# Patient Record
Sex: Male | Born: 1995 | Race: Black or African American | Hispanic: No | Marital: Single | Smoking: Never smoker
Health system: Southern US, Community
[De-identification: ages and names within clinical notes are randomized; demographics above are authoritative.]

## PROBLEM LIST (undated history)

## (undated) DIAGNOSIS — B20 Human immunodeficiency virus [HIV] disease: Secondary | ICD-10-CM

## (undated) DIAGNOSIS — Z21 Asymptomatic human immunodeficiency virus [HIV] infection status: Secondary | ICD-10-CM

## (undated) DIAGNOSIS — F419 Anxiety disorder, unspecified: Secondary | ICD-10-CM

## (undated) HISTORY — DX: Asymptomatic human immunodeficiency virus (hiv) infection status: Z21

## (undated) HISTORY — DX: Anxiety disorder, unspecified: F41.9

## (undated) HISTORY — DX: Human immunodeficiency virus (HIV) disease: B20

## (undated) HISTORY — PX: OTHER SURGICAL HISTORY: SHX169

---
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
## 2017-01-22 MED FILL — SULFAMETHOXAZOLE/TMP DS TAB: 800-160 | 30 days supply | Qty: 30 | Fill #0

## 2017-01-22 MED FILL — TRIUMEQ 600-50-300 MG TABS: 600-50-300 | 30 days supply | Qty: 30 | Fill #0

## 2017-05-21 ENCOUNTER — Telehealth: Payer: Self-pay | Admitting: *Deleted

## 2017-05-21 NOTE — Telephone Encounter (Signed)
Patient called asking where he can go to have his labs drawn in [place], [place]. [place], but he is now residing in [place] and does not have transportation to come here to have his labs drawn or to see the MD. Advised him to go to the health dept in [place] and let them know he is positive and they can request records. He said that he will most likely transfer to a [place] ID clinic. He will let [facility] know when his new MD is in place.

## 2017-08-18 ENCOUNTER — Emergency Department (HOSPITAL_COMMUNITY): Payer: Managed Care, Other (non HMO)

## 2017-08-18 ENCOUNTER — Encounter (HOSPITAL_COMMUNITY): Payer: Self-pay | Admitting: Emergency Medicine

## 2017-08-18 ENCOUNTER — Emergency Department (HOSPITAL_COMMUNITY)
Admission: EM | Admit: 2017-08-18 | Discharge: 2017-08-18 | Disposition: A | Discharge status: Home / Self Care | Payer: Managed Care, Other (non HMO) | Attending: Emergency Medicine | Admitting: Emergency Medicine

## 2017-08-18 DIAGNOSIS — B20 Human immunodeficiency virus [HIV] disease: Secondary | ICD-10-CM | POA: Insufficient documentation

## 2017-08-18 DIAGNOSIS — R1013 Epigastric pain: Secondary | ICD-10-CM | POA: Diagnosis not present

## 2017-08-18 DIAGNOSIS — R140 Abdominal distension (gaseous): Secondary | ICD-10-CM | POA: Insufficient documentation

## 2017-08-18 DIAGNOSIS — Z79899 Other long term (current) drug therapy: Secondary | ICD-10-CM | POA: Insufficient documentation

## 2017-08-18 LAB — COMPREHENSIVE METABOLIC PANEL
ALK PHOS: 67 U/L (ref 38–126)
ALT: 14 U/L — ABNORMAL LOW (ref 17–63)
ANION GAP: 6 (ref 5–15)
AST: 21 U/L (ref 15–41)
Albumin: 3.5 g/dL (ref 3.5–5.0)
BILIRUBIN TOTAL: 0.7 mg/dL (ref 0.3–1.2)
BUN: 5 mg/dL — AB (ref 6–20)
CALCIUM: 9 mg/dL (ref 8.9–10.3)
CO2: 26 mmol/L (ref 22–32)
Chloride: 105 mmol/L (ref 101–111)
Creatinine, Ser: 1.16 mg/dL (ref 0.61–1.24)
GFR calc Af Amer: >60 mL/min (ref >60)
GLUCOSE: 90 mg/dL (ref 65–99)
Potassium: 3.5 mmol/L (ref 3.5–5.1)
Sodium: 137 mmol/L (ref 135–145)
TOTAL PROTEIN: 8.1 g/dL (ref 6.5–8.1)

## 2017-08-18 LAB — URINALYSIS, ROUTINE W REFLEX MICROSCOPIC
Bacteria, UA: NONE SEEN
Bilirubin Urine: NEGATIVE
GLUCOSE, UA: NEGATIVE mg/dL
Hgb urine dipstick: NEGATIVE
Ketones, ur: NEGATIVE mg/dL
Nitrite: NEGATIVE
Protein, ur: NEGATIVE mg/dL
SPECIFIC GRAVITY, URINE: 1.024 (ref 1.005–1.030)
SQUAMOUS EPITHELIAL / LPF: NONE SEEN
pH: 5 (ref 5.0–8.0)

## 2017-08-18 LAB — CBC
HEMATOCRIT: 43.2 % (ref 39.0–52.0)
HEMOGLOBIN: 14.6 g/dL (ref 13.0–17.0)
MCH: 27.3 pg (ref 26.0–34.0)
MCHC: 33.8 g/dL (ref 30.0–36.0)
MCV: 80.7 fL (ref 78.0–100.0)
Platelets: 173 10*3/uL (ref 150–400)
RBC: 5.35 MIL/uL (ref 4.22–5.81)
RDW: 12.9 % (ref 11.5–15.5)
WBC: 5.8 10*3/uL (ref 4.0–10.5)

## 2017-08-18 LAB — LIPASE, BLOOD: Lipase: 23 U/L (ref 11–51)

## 2017-08-18 NOTE — ED Provider Notes (Signed)
[facility]-EMER[facility]ENCY DEPT Provider Note   CSN: [id number] Arrival date & time: 08/18/17  1478   History   Chief Complaint Chief Complaint  Patient presents with  . Abdominal Pain  . Bloated    HPI Phillip [name] is a 21 y.o. male.  The patient has a PMH of HIV, last CD4 =314 with VL =117 in 11/2016 currently off ART since around July, who presents with a 5 day history of epigastric abdominal pain, bloating, constipation, and nausea/vomiting. The patient states that he first noticed bloating and constipation on Monday. He went to urgent care and was told that he likely had some gas and or constipation. Since then he has been drinking prune juice and has drank magnesium citrate to help with constipation. After these interventions the patient has had multiple, small volume bowel movements. He states that his bowel movements started as pellets, but have recently become more liquid in nature. He denies blood in stool and dark, tarry stools. He notes worsening epigastric pain that does not radiate to other quadrants. He denies symptoms of acid reflux and has not noticed any change in pain with eating. He denies fevers, decreased  chills, SOB, chest pain, and hematemesis.   Patient states that he has not taken ART since July because he was living in [place] and didn't have access to [place] doctors to refill his medicine. Since returning to [employer] [facility] for school, the patient states that he wants to reestablish care with previous ID doctors to get the medications he needs. He thinks he may need to change HIV medications when he restarts taking them, however, because his previous medicines caused him to have eczema.    The history is provided by the patient.    Past Medical History:  Diagnosis Date  . Anxiety   . HIV infection [facility])     Patient Active Problem List   Diagnosis Date Noted  . HIV disease (HCC) 09/04/2016  . 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 Gonorrhea 09/04/2016  . Generalized  anxiety disorder 09/04/2016    Past Surgical History:  Procedure Laterality Date  . Testicular torsion       Home Medications    Prior to Admission medications   Medication Sig Start Date End Date Taking? Authorizing Provider  abacavir-dolutegravir-lamiVUDine (TRIUMEQ) 600-50-300 MG tablet Take 1 tablet by mouth daily. 10/09/16   Cliffton Asters, MD  cetirizine (ZYRTEC) 10 MG tablet Take 10 mg by mouth daily.    [provider]  sulfamethoxazole-trimethoprim (BACTRIM DS,SEPTRA DS) 800-160 MG tablet Take 1 tablet by mouth daily. 10/09/16   Cliffton Asters, MD    Family History Family History  Problem Relation Age of Onset  . Hypertension Mother     Social History Social History  Substance Use Topics  . Smoking status: Never Smoker  . Smokeless tobacco: Never Used  . Alcohol use No    Allergies   Patient has no allergy information on record.   Review of Systems Review of Systems  Constitutional: Negative for chills, diaphoresis, fatigue and fever.  HENT: Negative for congestion, sinus pain and sinus pressure.   Respiratory: Negative for cough.   Cardiovascular: Negative for chest pain and leg swelling.  Gastrointestinal: Positive for abdominal distention, abdominal pain, constipation, diarrhea, nausea and vomiting. Negative for anal bleeding, blood in stool and rectal pain.  Genitourinary: Negative for difficulty urinating, dysuria, flank pain, frequency and hematuria.  Musculoskeletal: Negative for arthralgias and myalgias.  Skin: Negative for rash.   Physical Exam Updated Vital Signs BP Marland Kitchen)  136/91 (BP Location: Right Arm)   Pulse 79   Temp 98.9 F (37.2 C) (Oral)   Resp 16   Ht 5\' 8"  (1.727 m)   Wt 71.2 kg (157 lb)   SpO2 100%   BMI 23.87 kg/m   Physical Exam  Constitutional: He appears well-developed and well-nourished. No distress.  HENT:  Mouth/Throat: Oropharynx is clear and moist. No oropharyngeal exudate.  Cardiovascular: Normal rate,  regular rhythm and intact distal pulses.  Exam reveals no friction rub.   No murmur heard. Pulmonary/Chest: Effort normal. No respiratory distress. He has no wheezes.  No crackles  Abdominal: Soft. He exhibits no distension and no mass. There is tenderness (epigastric region). There is no rebound and no guarding.  Musculoskeletal: He exhibits no edema (of bilateral lower extremities) or tenderness (of bilateral lower extremities).  Lymphadenopathy:    He has no cervical adenopathy.  Skin: Skin is warm and dry. Capillary refill takes less than 2 seconds. No rash noted. He is not diaphoretic. No erythema.    ED Treatments / Results  Labs (all labs ordered are listed, but only abnormal results are displayed) Labs Reviewed  COMPREHENSIVE METABOLIC PANEL - Abnormal; Notable for the following:       Result Value   BUN 5 (*)    ALT 14 (*)    All other components within normal limits  CBC  LIPASE, BLOOD  URINALYSIS, ROUTINE W REFLEX MICROSCOPIC    EKG  EKG Interpretation None       Radiology Dg Abd Acute W/chest  Result Date: 08/18/2017 CLINICAL DATA:  Five day history of abdominal pain associated with nausea and vomiting. Current history of HIV. EXAM: DG ABDOMEN ACUTE W/ 1V CHEST COMPARISON:  None. FINDINGS: Bowel gas pattern unremarkable without evidence of obstruction or significant ileus. No evidence of free air or significant air-fluid levels on the erect image. Expected stool burden in the colon. Phleboliths in the left side of the pelvis. No visible opaque urinary tract calculi. Cardiomediastinal silhouette unremarkable. Lungs clear. Bronchovascular markings normal. Pulmonary vascularity normal. No visible pleural effusions. No pneumothorax. Thoracolumbar dextroscoliosis. IMPRESSION: 1. No acute abdominal abnormality. 2.  No acute cardiopulmonary disease. Electronically Signed   By: Hulan Saas M.D.   On: 08/18/2017 10:50   Procedures Procedures (including critical care  time)  Medications Ordered in ED Medications - No data to display   Initial Impression / Assessment and Plan / ED Course  I have reviewed the triage vital signs and the nursing notes.  Pertinent labs & imaging results that were available during my care of the patient were reviewed by me and considered in my medical decision making (see chart for details).  Patient's history of constipation, bloating, nausea, and vomiting are concerning for obstruction. The patient, however, has been able to pass gas and has had multiple bowel movements (including 1 during the interview) which is not consistent with this diagnosis. The patient could have an ileus, which sometimes results from electrolyte disturbances that can result from decreased PO intake/increased vomiting. I plan to obtain acute abdominal X-rays to rule out obstruction and/or ileus.  The patient's epigastric pain is not described as sharp, does not change with eating, and does not radiate. Will obtain lipase and CMP to rule out hepatitis and pancreatitis, although these are very unlikely given the patient's progression of symptoms, stable vital signs within normal limits, and PE without upper quadrant tenderness.  The new onset diarrhea and recent history of being unable to take ART  is concerning for infective colitis. The patient does not have signs/symptoms of colitis on exam, as he does not have lower quadrant abdominal pain. Will obtain CBC to determine if WBC levels are at baseline compared to previous levels in 2017 on ART. I have a low suspicion for infectious colitis given the patient's overall presentation. I think it is more likely that the patient has over-treated his constipation, which is why he has new onset diarrhea. Will consider stool studies, but these would likely be better performed as an outpatient if the diarrhea persists for longer periods of time.   Final Clinical Impressions(s) / ED Diagnoses   Final diagnoses:  None    The patient's CMP and lipase are within normal limits. His WBC level is similar to historical values. He remains afebrile and other vital signs are within normal limits. No acute evidence of obstruction observed on abdominal plain film imaging. This is all reassuring and supportive of the hypothesis that the patient's recent onset of nausea and diarrhea are a result of OTC medication over use.   As stated above, the patient likely had constipation earlier in the week which was over-corrected with OTC medications and now has cramping, nausea, and diarrhea associated with increased laxative and GI stimulant use. Patient will be discharged with instructions to stay hydrated and to stop OTC anti-constipation medicines. He was provided with information about good bowel regimens to be used in the future that may prevent similar presentations to the ED.   He was also given information to follow up with previous ID clinic providers since he has not been seen for awhile and has been off ART.   New Prescriptions New Prescriptions   No medications on file     Rozann LeschesNedrud, Ercel Pepitone, MD 08/18/17 1300

## 2017-08-18 NOTE — ED Triage Notes (Signed)
Pt reports ABD pain that started on Monday. Pt took a bottle of Mag citrate  On wed. With some stool results. Pt reports he has had constipation in the past.

## 2017-08-18 NOTE — ED Provider Notes (Signed)
I saw and evaluated the patient, reviewed the resident's note and I agree with the findings and plan.   EKG Interpretation None     21 year old male here cleaning of diarrhea after taking medications for constipation. Abdominal exam is benign. Workup is reassuring. Will be discharged home   Lorre NickAllen, Yuuki Skeens, MD 08/18/17 1228

## 2017-08-18 NOTE — ED Triage Notes (Signed)
Pt. Stated, Lavenia Atlasve been bloated and stomach hurt. I went to UC and they said it was gas and should go away shortly.  I thought I was constipated but Ive gone to the bathroom.

## 2017-08-18 NOTE — ED Notes (Signed)
Declined W/C at D/C and was escorted to lobby by RN. 

## 2017-08-28 ENCOUNTER — Other Ambulatory Visit: Payer: Self-pay | Admitting: Gastroenterology

## 2017-08-28 DIAGNOSIS — R1013 Epigastric pain: Secondary | ICD-10-CM

## 2017-09-05 ENCOUNTER — Ambulatory Visit (INDEPENDENT_AMBULATORY_CARE_PROVIDER_SITE_OTHER): Payer: Managed Care, Other (non HMO) | Admitting: Pharmacist Clinician (PhC)/ Clinical Pharmacy Specialist

## 2017-09-05 ENCOUNTER — Other Ambulatory Visit: Payer: Managed Care, Other (non HMO)

## 2017-09-05 DIAGNOSIS — B20 Human immunodeficiency virus [HIV] disease: Secondary | ICD-10-CM | POA: Diagnosis not present

## 2017-09-05 DIAGNOSIS — Z23 Encounter for immunization: Secondary | ICD-10-CM | POA: Diagnosis not present

## 2017-09-05 MED FILL — TRIUMEQ 600-50-300 MG TABS: 600-50-300 | 30 days supply | Qty: 30 | Fill #1

## 2017-09-05 NOTE — Progress Notes (Signed)
HPI: Phillip [name] is a 21 y.o. male who is here to get his HIV labs.   Allergies: Not on File  Vitals:    Past Medical History: Past Medical History:  Diagnosis Date  . Anxiety   . HIV infection [facility])     Social History: Social History   Social History  . Marital status: Single    Spouse name: N/A  . Number of children: N/A  . Years of education: N/A   Social History Main Topics  . Smoking status: Never Smoker  . Smokeless tobacco: Never Used  . Alcohol use No  . Drug use: No  . Sexual activity: Not Currently    Partners: Male    Birth control/ protection: Condom     Comment: given condoms   Other Topics Concern  . Not on file   Social History Narrative  . No narrative on file    Previous Regimen: None  Current Regimen: Triumeq  Labs: HIV 1 RNA Quant (copies/mL)  Date Value  11/21/2016 117 (H)  09/04/2016 245,715 (H)   CD4 T Cell Abs (/uL)  Date Value  11/21/2016 370 (L)  09/04/2016 160 (L)   Hep B S Ab (no units)  Date Value  09/04/2016 POS (A)   Hepatitis B Surface Ag (no units)  Date Value  09/04/2016 NEGATIVE   HCV Ab (no units)  Date Value  09/04/2016 NEGATIVE    CrCl: CrCl cannot be calculated (Unknown ideal weight.).  Lipids:    Component Value Date/Time   CHOL 100 (L) 09/04/2016 1355   TRIG 81 09/04/2016 1355   HDL 27 (L) 09/04/2016 1355   CHOLHDL 3.7 09/04/2016 1355   VLDL 16 09/04/2016 1355   LDLCALC 57 09/04/2016 1355    Assessment: Phillip [name] was here to get his HIV labs before the visit with Dr. Orvan Falconer in OctFeliz [name] came into our office and ask if we could talk to him about the rash issue. He is currently on Triumeq and Bactrim for his HIV. During the visit he said that has some facial rash. He denied having any STD symptoms (chancre, bodily rashes, ect). The rash is mostly in his face but I can't see any today. He has not seen Phillip [name] since 2017 because he stated that he was working at [employer] in [place] during this  summer. The rash could be do either Triumeq or Septra. However, after reviewing his chart after the visit, it looks like he complained of similar rash at the initial visit with Dr. Orvan Falconer. His CD4 was 370 back in Dec. Told him that we could try a process of elimination to see if this is a drug reaction. He is going hold the Bactrim for 2 weeks until the appt with Dr. Orvan Falconer in Oct.   During the visit, he stated that he has been good about taking his meds. I took him to the pharmacy office to get him a map to [facility] pharmacy, Kathie Rhodes said that his refill has been overdue for several months. Subsequently, the story came out that his adherence was not the best in June and July due to some "stomach" symptoms. Now, I fear that he may have developed resistance already. He probably start taking meds again knowing that his appt with Dr. Orvan Falconer is coming up. A viral without reflex was done today. I got Travis/Katrina to change it to reflex (all classes). Told him to make sure to show up for the visit with Dr. Orvan Falconer in 2 weeks because we may  need to change his ART.   Start hep A, Menveo series today along with flu.   Recommendations:  Hold Bactrim x2 wks Continue Triumeq for now Change HIV VL to reflex Hep A #1, Menveo #1, flu today F/u in 2 mohths for Menveo #2 F/u with Dr. Orvan Falconer in 2 wks  Phillip [name], PharmD, BCPS, AAHIVP, CPP Clinical Infectious Disease Pharmacist [facility] 09/05/2017, 1:51 PM

## 2017-09-05 NOTE — Patient Instructions (Signed)
Stop Bactrim to see if the rash is resolved. We could consider changing Triumeq at the appt with Dr. Orvan Falconer if it's still there.

## 2017-09-06 LAB — FLUORESCENT TREPONEMAL AB(FTA)-IGG-BLD: Fluorescent Treponemal ABS: REACTIVE — AB

## 2017-09-06 LAB — COMPREHENSIVE METABOLIC PANEL
AG RATIO: 1 (calc) (ref 1.0–2.5)
ALT: 53 U/L — AB (ref 8–46)
AST: 270 U/L — ABNORMAL HIGH (ref 12–32)
Albumin: 4.3 g/dL (ref 3.6–5.1)
Alkaline phosphatase (APISO): 61 U/L (ref 48–230)
BILIRUBIN TOTAL: 0.4 mg/dL (ref 0.2–1.1)
BUN: 9 mg/dL (ref 7–20)
CALCIUM: 9.6 mg/dL (ref 8.9–10.4)
CO2: 32 mmol/L (ref 20–32)
Chloride: 103 mmol/L (ref 98–110)
Creat: 1.17 mg/dL (ref 0.60–1.26)
Globulin: 4.2 g/dL (calc) — ABNORMAL HIGH (ref 2.1–3.5)
Glucose, Bld: 97 mg/dL (ref 65–99)
Potassium: 4.6 mmol/L (ref 3.8–5.1)
SODIUM: 141 mmol/L (ref 135–146)
TOTAL PROTEIN: 8.5 g/dL — AB (ref 6.3–8.2)

## 2017-09-06 LAB — T-HELPER CELL (CD4) - (RCID CLINIC ONLY)
CD4 % Helper T Cell: 16 % — ABNORMAL LOW (ref 33–55)
CD4 T Cell Abs: 260 /uL — ABNORMAL LOW (ref 400–2700)

## 2017-09-06 LAB — CBC
HEMATOCRIT: 45.3 % (ref 38.5–50.0)
HEMOGLOBIN: 15.5 g/dL (ref 13.2–17.1)
MCH: 27.5 pg (ref 27.0–33.0)
MCHC: 34.2 g/dL (ref 32.0–36.0)
MCV: 80.5 fL (ref 80.0–100.0)
MPV: 9.1 fL (ref 7.5–12.5)
Platelets: 300 10*3/uL (ref 140–400)
RBC: 5.63 10*6/uL (ref 4.20–5.80)
RDW: 13.1 % (ref 11.0–15.0)
WBC: 6.1 10*3/uL (ref 3.8–10.8)

## 2017-09-06 LAB — LIPID PANEL
Cholesterol: 92 mg/dL (ref <170)
HDL: 25 mg/dL — ABNORMAL LOW (ref >45)
LDL Cholesterol (Calc): 51 mg/dL (calc) (ref <110)
Non-HDL Cholesterol (Calc): 67 mg/dL (calc) (ref <120)
TRIGLYCERIDES: 76 mg/dL (ref <90)
Total CHOL/HDL Ratio: 3.7 (calc) (ref <5.0)

## 2017-09-06 LAB — RPR TITER: RPR Titer: 1:1 {titer} — ABNORMAL HIGH

## 2017-09-06 LAB — RPR: RPR Ser Ql: REACTIVE — AB

## 2017-09-18 ENCOUNTER — Ambulatory Visit (INDEPENDENT_AMBULATORY_CARE_PROVIDER_SITE_OTHER): Payer: Managed Care, Other (non HMO) | Admitting: Internal Medicine

## 2017-09-18 ENCOUNTER — Other Ambulatory Visit: Payer: Self-pay | Admitting: Pharmacist

## 2017-09-18 ENCOUNTER — Encounter: Payer: Self-pay | Admitting: Internal Medicine

## 2017-09-18 ENCOUNTER — Ambulatory Visit (INDEPENDENT_AMBULATORY_CARE_PROVIDER_SITE_OTHER): Payer: Managed Care, Other (non HMO) | Admitting: Licensed Clinical Social Worker

## 2017-09-18 DIAGNOSIS — A5149 Other secondary syphilitic conditions: Secondary | ICD-10-CM | POA: Diagnosis not present

## 2017-09-18 DIAGNOSIS — B20 Human immunodeficiency virus [HIV] disease: Secondary | ICD-10-CM

## 2017-09-18 DIAGNOSIS — K5909 Other constipation: Secondary | ICD-10-CM | POA: Diagnosis not present

## 2017-09-18 DIAGNOSIS — K5900 Constipation, unspecified: Secondary | ICD-10-CM | POA: Insufficient documentation

## 2017-09-18 DIAGNOSIS — F4322 Adjustment disorder with anxiety: Secondary | ICD-10-CM

## 2017-09-18 DIAGNOSIS — F411 Generalized anxiety disorder: Secondary | ICD-10-CM

## 2017-09-18 NOTE — Patient Instructions (Signed)
Try taking over-the-counter docusate for your constipation.

## 2017-09-18 NOTE — Progress Notes (Signed)
Patient Active Problem List   Diagnosis Date Noted  . HIV disease (HCC) 09/04/2016    Priority: High  . Constipation 09/18/2017  . 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 Gonorrhea 09/04/2016  . Generalized anxiety disorder 09/04/2016    Patient's Medications  New Prescriptions   No medications on file  Previous Medications   ABACAVIR-DOLUTEGRAVIR-LAMIVUDINE (TRIUMEQ) 600-50-300 MG TABLET    Take 1 tablet by mouth daily.   SULFAMETHOXAZOLE-TRIMETHOPRIM (BACTRIM DS,SEPTRA DS) 800-160 MG TABLET    Take 1 tablet by mouth daily.  Modified Medications   No medications on file  Discontinued Medications   No medications on file    Subjective: Phillip [name] is in for his routine HIV follow-up visit. He stopped taking his Triumeq last March when he developed a flare of his eczema and was feeling tired. He thought that those symptoms might be side effects of his Septra or Triumeq. 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 He started back on his Triumeq on 09/05/2017. He has not missed any doses since then. He is tolerating it well. He has not been sexually active recently. He has had problems with severe constipation for the past several months. He has had to strain to have a bowel movement and has noted some discomfort and a few spots of blood on the toilet paper.  Review of Systems: Review of Systems  Constitutional: Negative for chills, diaphoresis, fever, malaise/fatigue and weight loss.  HENT: Negative for sore throat.   Respiratory: Negative for cough, sputum production and shortness of breath.   Cardiovascular: Negative for chest pain.  Gastrointestinal: Positive for blood in stool and constipation. Negative for abdominal pain, diarrhea, heartburn, nausea and vomiting.  Genitourinary: Negative for dysuria and frequency.  Musculoskeletal: Negative for joint pain and myalgias.  Skin: Negative for rash.    Neurological: Negative for dizziness and headaches.  Psychiatric/Behavioral: Negative for depression and substance abuse. The patient is not nervous/anxious.        He tells me that he "over thinks things".    Past Medical History:  Diagnosis Date  . Anxiety   . HIV infection [facility])     Social History  Substance Use Topics  . Smoking status: Never Smoker  . Smokeless tobacco: Never Used  . Alcohol use No    Family History  Problem Relation Age of Onset  . Hypertension Mother     Not on File  Objective:  Vitals:   09/18/17 1403  BP: 127/75  Pulse: (!) 123  Temp: (!) 101.1 F (38.4 C)  TempSrc: Oral  Weight: 150 lb 6.4 oz (68.2 kg)   Body mass index is 22.87 kg/m.  Physical Exam  Constitutional: He is oriented to person, place, and time.  He is smiling and in good spirits.  HENT:  Mouth/Throat: No oropharyngeal exudate.  Eyes: Conjunctivae are normal.  Cardiovascular: Normal rate and regular rhythm.   No murmur heard. Pulmonary/Chest: Effort normal and breath sounds normal.  Abdominal: Soft. He exhibits no mass. There is no tenderness.  Musculoskeletal: Normal range of motion.  Neurological: He is alert and oriented to person, place, and time.  Skin: No rash noted.  Psychiatric: Mood and affect normal.    Lab Results Lab Results  Component Value Date   WBC 6.1 09/05/2017   HGB 15.5 09/05/2017   HCT 45.3 09/05/2017   MCV 80.5 09/05/2017   PLT 300  09/05/2017    Lab Results  Component Value Date   CREATININE 1.17 09/05/2017   BUN 9 09/05/2017   NA 141 09/05/2017   K 4.6 09/05/2017   CL 103 09/05/2017   CO2 32 09/05/2017    Lab Results  Component Value Date   ALT 53 (H) 09/05/2017   AST 270 (H) 09/05/2017   ALKPHOS 67 08/18/2017   BILITOT 0.4 09/05/2017    Lab Results  Component Value Date   CHOL 92 09/05/2017   HDL 25 (L) 09/05/2017   LDLCALC 57 09/04/2016   TRIG 76 09/05/2017   CHOLHDL 3.7 09/05/2017   Lab Results  Component Value  Date   LABRPR REACTIVE (A) 09/05/2017   RPRTITER 1:1 (H) 09/05/2017   HIV 1 RNA Quant (copies/mL)  Date Value  09/05/2017 99,200 (H)  09/05/2017 131,000 (H)  11/21/2016 117 (H)   CD4 T Cell Abs (/uL)  Date Value  09/05/2017 260 (L)  11/21/2016 370 (L)  09/04/2016 160 (L)     Problem List Items Addressed This Visit      High   HIV disease (HCC)    His infection went out of control after stopping his Triumeq 6 months ago. I will repeat his lab work in 6 weeks.      Relevant Orders   T-helper cell (CD4)- ([facility] clinic only)   HIV 1 RNA quant-no reflex-bld   CBC   Comprehensive metabolic panel     Unprioritized   Constipation    I will have him try over-the-counter docusate.      Generalized anxiety disorder    I introduced him to our counselor, Vergia Alberts, today. He says that his relationship with his mother is very good now. He denies feeling anxious or depressed currently but does admit that his over thinking of issues can sometimes cause him distress.      Secondary syphilis    His RPR has decreased from 1:64 down to 1:1 following treatment for syphilis last year.           Cliffton Asters, MD [facility] [phone] pager   [phone] cell 09/18/2017, 2:31 PM

## 2017-09-18 NOTE — Assessment & Plan Note (Signed)
His infection went out of control after stopping his Triumeq 6 months ago. I will repeat his lab work in 6 weeks.

## 2017-09-18 NOTE — Assessment & Plan Note (Signed)
His RPR has decreased from 1:64 down to 1:1 following treatment for syphilis last year.

## 2017-09-18 NOTE — Assessment & Plan Note (Signed)
I introduced him to our counselor, Vergia Alberts, today. He says that his relationship with his mother is very good now. He denies feeling anxious or depressed currently but does admit that his over thinking of issues can sometimes cause him distress.

## 2017-09-18 NOTE — Assessment & Plan Note (Signed)
I will have him try over-the-counter docusate.

## 2017-09-19 NOTE — BH Specialist Note (Signed)
Integrated Behavioral Health Initial Visit  MRN: [id number] Name: Daric Koren  Number of Integrated Behavioral Health Clinician visits:: 1/6 Session Start time: 2:26 pm  Session End time: 2:43 pm Total time: 17 mins  Type of Service: Integrated Behavioral Health- Individual/Family Interpretor:No. Interpretor Name and Language: N/A   Warm Hand Off Completed.       SUBJECTIVE: Bronislaus Verdell is a 21 y.o. male accompanied by self Patient was referred by Dr. Orvan Falconer for anxiety.  Patient reports the following symptoms/concerns: Patient reported experience of continous anxious mood.  At best he experiences "1/2 anxious mood and 1/2 content mood".  Patient reported experience of thoughts fixed on his future and his occupation, but denied parental pressures.  Patient reported uncertainty with his major and creative skills and was receptive to utilizing services of [employer] for interest/career inventories.  Patient denied extended periods of sadness or depression, denied past self-mutilation, denied past suicide attempt, and denied current suicidal ideations, plan, or intent.  Duration of problem: 2 months; Severity of problem: mild  OBJECTIVE: Mood: Anxious and Affect: within range Risk of harm to self or others: No plan to harm self or others  LIFE CONTEXT: Family and Social: Patient is from [place] and the majority of his family is there.  Patient reported having friends and a support network on campus. School/Work: Patient attends [employer]. Self-Care: Patient is able to tend to his ADL's but has issues with treatment compliance.  Patient stopped taking HIV medications in March due to his allergies and because he did not know how to get his medications while home on summer semester break. 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  ASSESSMENT: Patient is currently experiencing college/career-related anxieties and may benefit from assessment and  intervention from the [employer] and [facility].  GOALS ADDRESSED: Patient will: 1. Reduce symptoms of: anxiety 2. Increase knowledge and/or ability of: coping skills, healthy habits and stress reduction  3. Demonstrate ability to: Increase healthy adjustment to current life circumstances, Increase adequate support systems for patient/family and Increase motivation to adhere to plan of care  INTERVENTIONS: Interventions utilized: Supportive Counseling and Link to [facility]    PLAN: 1. Referral(s): Counselor 2. Patient will contact [facility] if additional assistance is required in the future.  Vergia Alberts, [facility]

## 2017-09-20 LAB — TEST AUTHORIZATION

## 2017-09-20 LAB — HIV-1 INTEGRASE GENOTYPE

## 2017-09-20 LAB — HIV-1 RNA QUANT-NO REFLEX-BLD
HIV 1 RNA Quant: 99200 copies/mL — ABNORMAL HIGH
HIV-1 RNA Quant, Log: 5 Log copies/mL — ABNORMAL HIGH

## 2017-09-20 LAB — HIV RNA, RTPCR W/R GT (RTI, PI,INT)
HIV 1 RNA QUANT: 131000 {copies}/mL — AB
HIV-1 RNA QUANT, LOG: 5.12 {Log_copies}/mL — AB

## 2017-09-20 LAB — HIV-1 GENOTYPE: HIV-1 Genotype: DETECTED — AB

## 2017-10-01 MED FILL — TRIUMEQ 600-50-300 MG TABS: 600-50-300 | 30 days supply | Qty: 30 | Fill #2

## 2017-10-25 ENCOUNTER — Other Ambulatory Visit: Payer: Self-pay | Admitting: Internal Medicine

## 2017-10-25 DIAGNOSIS — B20 Human immunodeficiency virus [HIV] disease: Secondary | ICD-10-CM

## 2017-10-25 MED FILL — TRIUMEQ 600-50-300 MG TABS: 600-50-300 | 30 days supply | Qty: 30 | Fill #0

## 2017-11-06 ENCOUNTER — Ambulatory Visit: Payer: Managed Care, Other (non HMO)

## 2017-11-12 ENCOUNTER — Ambulatory Visit (INDEPENDENT_AMBULATORY_CARE_PROVIDER_SITE_OTHER): Payer: Managed Care, Other (non HMO) | Admitting: Pharmacist Clinician (PhC)/ Clinical Pharmacy Specialist

## 2017-11-12 DIAGNOSIS — B20 Human immunodeficiency virus [HIV] disease: Secondary | ICD-10-CM

## 2017-11-12 DIAGNOSIS — Z23 Encounter for immunization: Secondary | ICD-10-CM

## 2017-11-12 MED ORDER — ABACAVIR-DOLUTEGRAVIR-LAMIVUD 600-50-300 MG PO TABS: 1.0000 | ORAL_TABLET | Freq: Every day | ORAL | 2 refills | Status: AC

## 2017-11-12 NOTE — Progress Notes (Signed)
HPI: Phillip [name] is a 21 y.o. male who is here to f/u with pharmacy for his second menveo shot.   Allergies: No Known Allergies  Vitals:    Past Medical History: Past Medical History:  Diagnosis Date  . Anxiety   . HIV infection [facility](HCC)     Social History: Social History   Socioeconomic History  . Marital status: Single    Spouse name: Not on file  . Number of children: Not on file  . Years of education: Not on file  . Highest education level: Not on file  Social Needs  . Financial resource strain: Not on file  . Food insecurity - worry: Not on file  . Food insecurity - inability: Not on file  . Transportation needs - medical: Not on file  . Transportation needs - non-medical: Not on file  Occupational History  . Not on file  Tobacco Use  . Smoking status: Never Smoker  . Smokeless tobacco: Never Used  Substance and Sexual Activity  . Alcohol use: No  . Drug use: No  . Sexual activity: Not Currently    Partners: Male    Birth control/protection: Condom    Comment: given condoms  Other Topics Concern  . Not on file  Social History Narrative  . Not on file    Previous Regimen: None  Current Regimen: Triumeq  Labs: HIV 1 RNA Quant (copies/mL)  Date Value  09/05/2017 99,200 (H)  09/05/2017 131,000 (H)  11/21/2016 117 (H)   CD4 T Cell Abs (/uL)  Date Value  09/05/2017 260 (L)  11/21/2016 370 (L)  09/04/2016 160 (L)   Hep B S Ab (no units)  Date Value  09/04/2016 POS (A)   Hepatitis B Surface Ag (no units)  Date Value  09/04/2016 NEGATIVE   HCV Ab (no units)  Date Value  09/04/2016 NEGATIVE    CrCl: CrCl cannot be calculated (Patient's most recent lab result is older than the maximum 21 days allowed.).  Lipids:    Component Value Date/Time   CHOL 92 09/05/2017 1120   TRIG 76 09/05/2017 1120   HDL 25 (L) 09/05/2017 1120   CHOLHDL 3.7 09/05/2017 1120   VLDL 16 09/04/2016 1355   LDLCALC 57 09/04/2016 1355    Assessment: Phillip [name] is here  for his second Menveo vaccine with pharmacy. He stated that his mom has just changed job and has lost her insurance. He is trying to get help with bridging his care so he doesn't run out of meds.  He stated that he has not missed any doses since the last visit. We went through [employer] patient assistance to get him 3 mo of coverage. This will be plenty of time for his mom's insurance to kick in. [facility] can [facility] out to him. While he is here, we are going to get all labs since he has an appt with Dr. Orvan Falconerampbell next week.   Recommendations:  Menveo #2 Continue Triumeq 1 PO qday ([facility]) All HIV meds today  Ulyses SouthwardMinh Catrena Vari, PharmD, BCPS, AAHIVP, CPP Clinical Infectious Disease Pharmacist [facility] 11/12/2017, 4:44 PM

## 2017-11-13 LAB — COMPREHENSIVE METABOLIC PANEL
AG RATIO: 1 (calc) (ref 1.0–2.5)
ALT: 9 U/L (ref 8–46)
AST: 19 U/L (ref 12–32)
Albumin: 4 g/dL (ref 3.6–5.1)
Alkaline phosphatase (APISO): 55 U/L (ref 48–230)
BILIRUBIN TOTAL: 0.5 mg/dL (ref 0.2–1.1)
BUN: 10 mg/dL (ref 7–20)
CALCIUM: 9.1 mg/dL (ref 8.9–10.4)
CO2: 29 mmol/L (ref 20–32)
Chloride: 105 mmol/L (ref 98–110)
Creat: 1.06 mg/dL (ref 0.60–1.26)
Globulin: 4 g/dL (calc) — ABNORMAL HIGH (ref 2.1–3.5)
Glucose, Bld: 102 mg/dL — ABNORMAL HIGH (ref 65–99)
Potassium: 4.2 mmol/L (ref 3.8–5.1)
Sodium: 141 mmol/L (ref 135–146)
Total Protein: 8 g/dL (ref 6.3–8.2)

## 2017-11-13 LAB — CBC
HEMATOCRIT: 42.4 % (ref 38.5–50.0)
HEMOGLOBIN: 14.7 g/dL (ref 13.2–17.1)
MCH: 28.4 pg (ref 27.0–33.0)
MCHC: 34.7 g/dL (ref 32.0–36.0)
MCV: 82 fL (ref 80.0–100.0)
MPV: 8.9 fL (ref 7.5–12.5)
Platelets: 237 10*3/uL (ref 140–400)
RBC: 5.17 10*6/uL (ref 4.20–5.80)
RDW: 13.2 % (ref 11.0–15.0)
WBC: 4.8 10*3/uL (ref 3.8–10.8)

## 2017-11-13 MED FILL — TRIUMEQ 600-50-300 MG TABS: 600-50-300 | 30 days supply | Qty: 30 | Fill #0

## 2017-11-14 LAB — HIV-1 RNA QUANT-NO REFLEX-BLD
HIV 1 RNA Quant: 119000 copies/mL — ABNORMAL HIGH
HIV-1 RNA Quant, Log: 5.08 Log copies/mL — ABNORMAL HIGH

## 2017-11-14 LAB — T-HELPER CELL (CD4) - (RCID CLINIC ONLY)
CD4 T CELL ABS: 400 /uL (ref 400–2700)
CD4 T CELL HELPER: 18 % — AB (ref 33–55)

## 2017-11-19 ENCOUNTER — Ambulatory Visit: Payer: Managed Care, Other (non HMO) | Admitting: Internal Medicine

## 2017-11-22 ENCOUNTER — Ambulatory Visit: Payer: Managed Care, Other (non HMO) | Admitting: Internal Medicine

## 2018-01-08 ENCOUNTER — Ambulatory Visit: Payer: Managed Care, Other (non HMO) | Admitting: Internal Medicine

## 2018-01-10 MED FILL — TRIUMEQ 600-50-300 MG TABS: 600-50-300 | 30 days supply | Qty: 30 | Fill #1

## 2018-01-17 ENCOUNTER — Ambulatory Visit: Payer: Self-pay | Admitting: Internal Medicine

## 2019-02-13 IMAGING — DX DG ABDOMEN ACUTE W/ 1V CHEST
3 series · 3 of 3 positions shown · non-contrast
Comparison: None.

CLINICAL DATA: Five day history of abdominal pain associated with
nausea and vomiting. Current history of HIV.

EXAM:
DG ABDOMEN ACUTE W/ 1V CHEST

[chest pa]
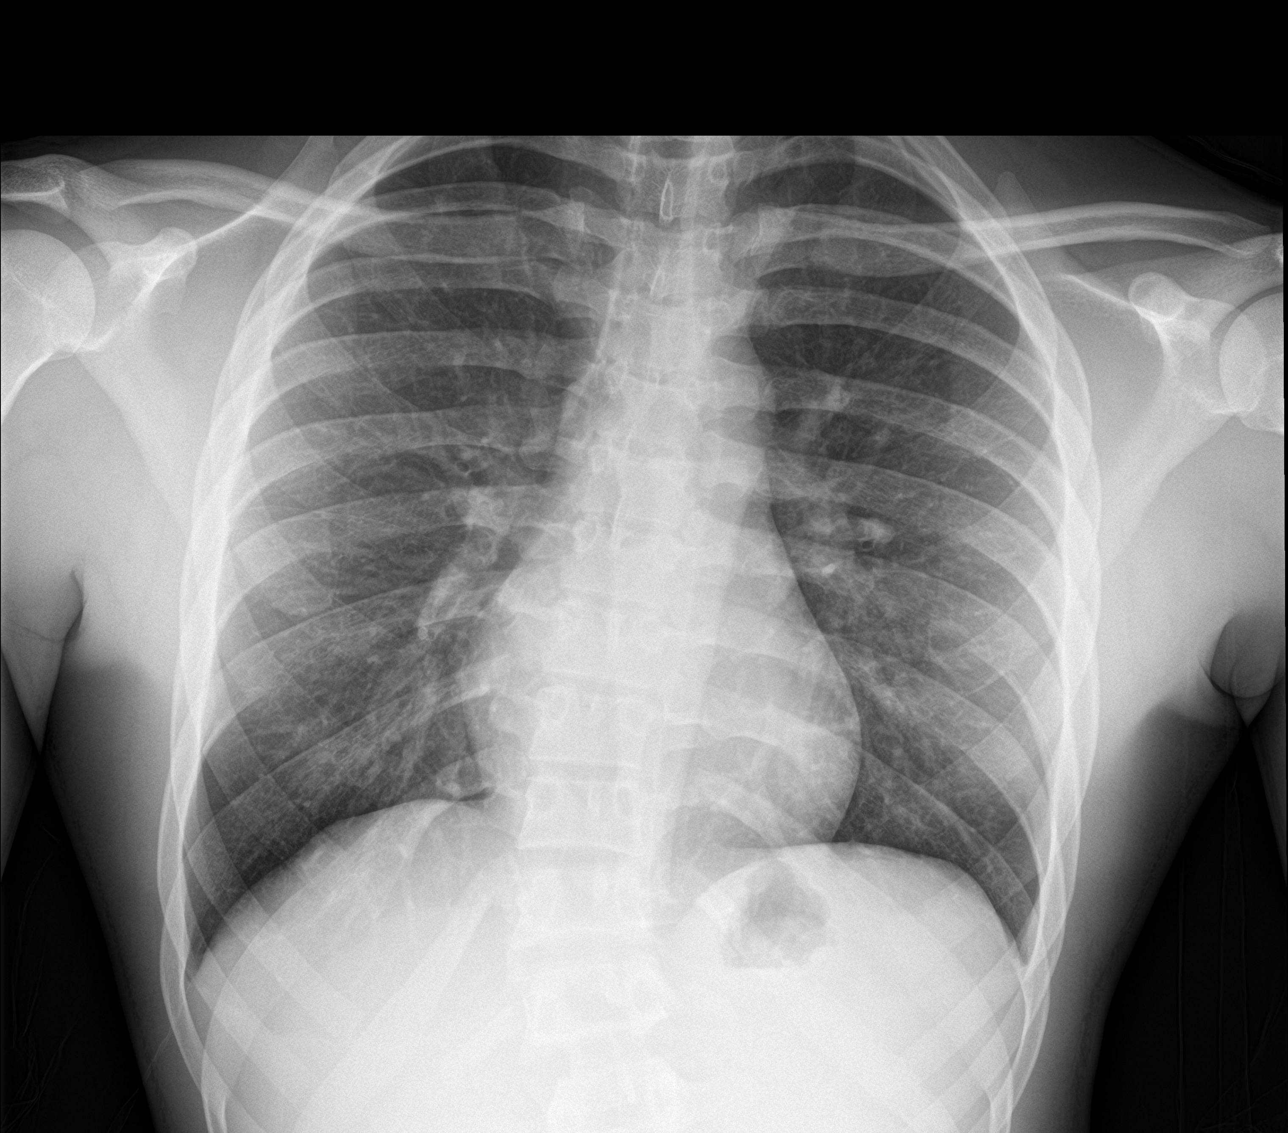

[abdomen erect]
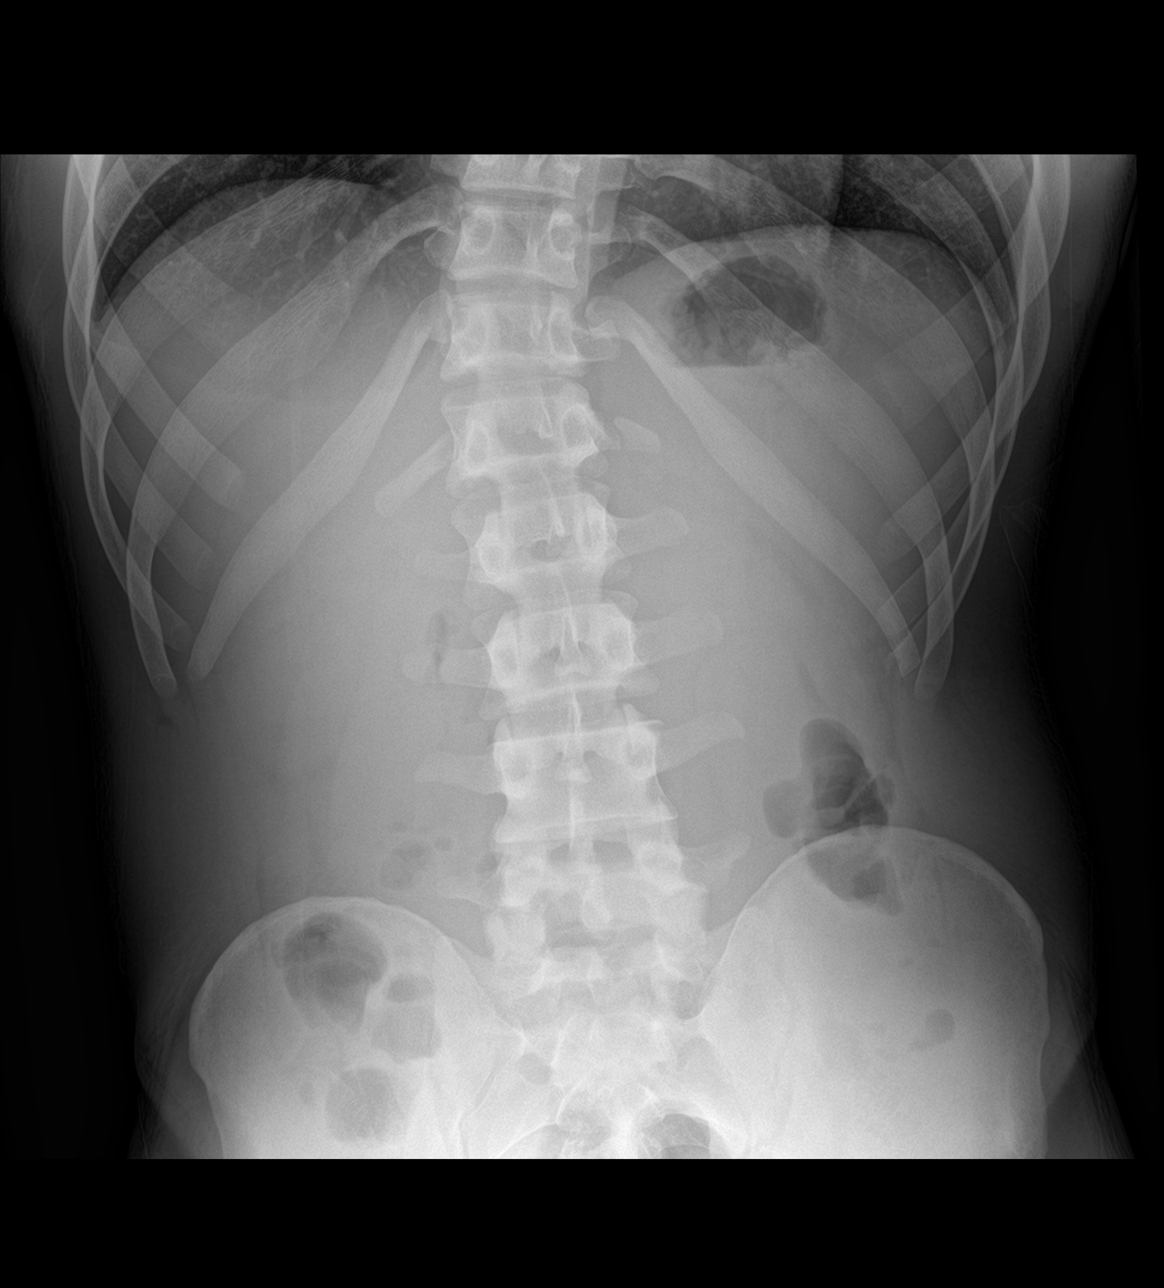

[abdomen supine]
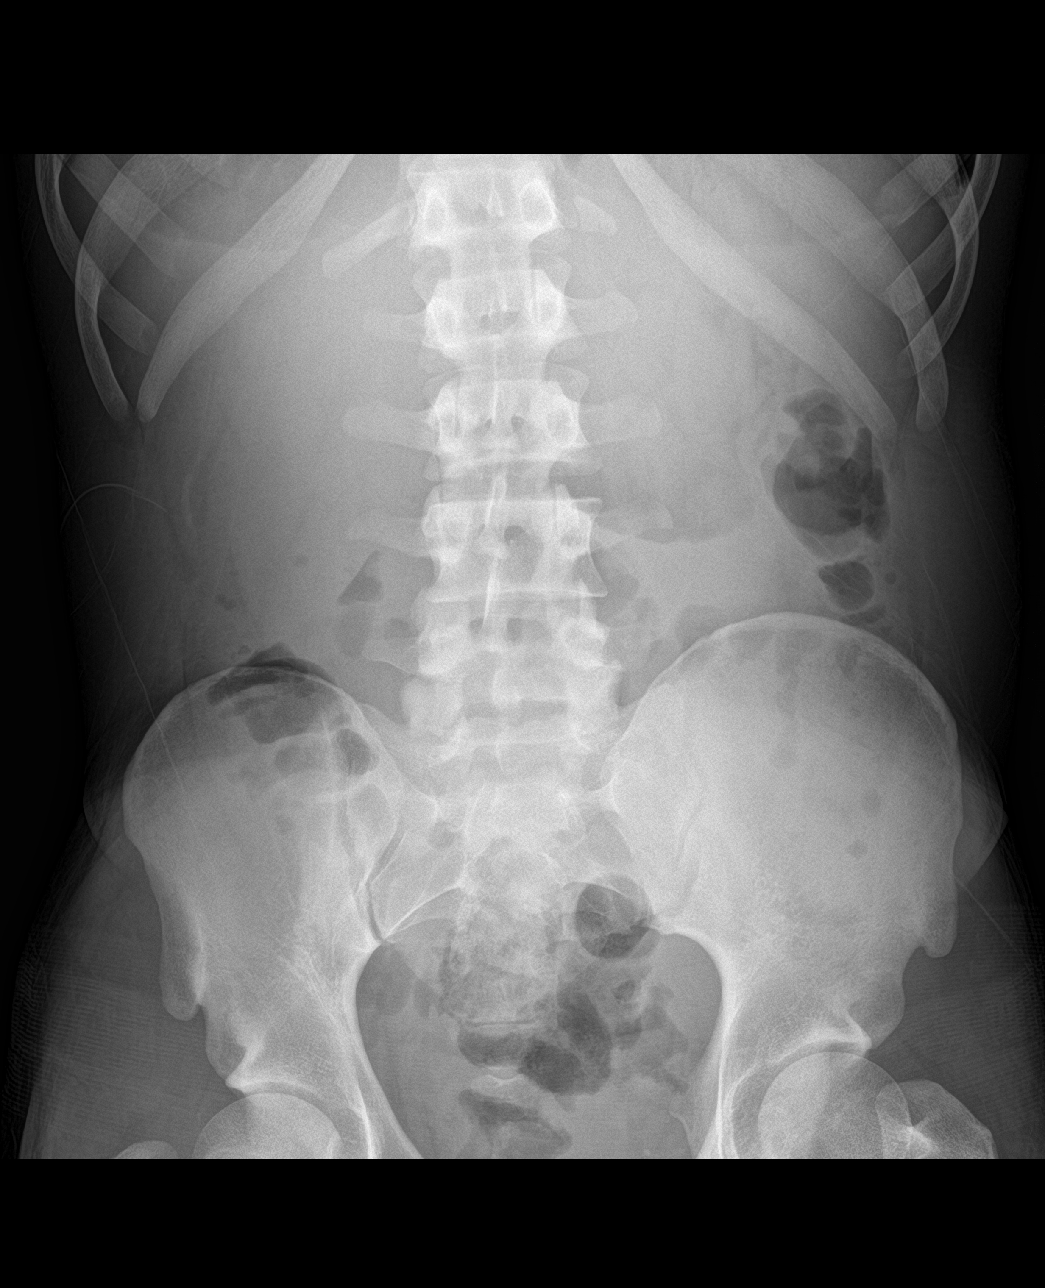

[3 of 3 positions shown; findings below may reference images not displayed]

FINDINGS: Bowel gas pattern unremarkable without evidence of obstruction or
significant ileus. No evidence of free air or significant air-fluid
levels on the erect image. Expected stool burden in the colon.
Phleboliths in the left side of the pelvis. No visible opaque
urinary tract calculi.

Cardiomediastinal silhouette unremarkable. Lungs clear.
Bronchovascular markings normal. Pulmonary vascularity normal. No
visible pleural effusions. No pneumothorax. Thoracolumbar
dextroscoliosis.
IMPRESSION: 1. No acute abdominal abnormality.
2.  No acute cardiopulmonary disease.
